# Patient Record
Sex: Female | Born: 1970 | Race: White | Hispanic: No | Marital: Married | Smoking: Never smoker
Health system: Southern US, Community
[De-identification: ages and names within clinical notes are randomized; demographics above are authoritative.]

## PROBLEM LIST (undated history)

## (undated) DIAGNOSIS — N921 Excessive and frequent menstruation with irregular cycle: Secondary | ICD-10-CM

## (undated) DIAGNOSIS — R102 Pelvic and perineal pain: Secondary | ICD-10-CM

## (undated) HISTORY — PX: TUBAL LIGATION: SHX77

## (undated) HISTORY — PX: CERVICAL CONE BIOPSY: SUR198

## (undated) HISTORY — DX: Pelvic and perineal pain: R10.2

## (undated) HISTORY — PX: LEEP: SHX91

## (undated) HISTORY — DX: Excessive and frequent menstruation with irregular cycle: N92.1

---

## 1999-04-03 ENCOUNTER — Emergency Department (HOSPITAL_COMMUNITY): Admission: EM | Admit: 1999-04-03 | Discharge: 1999-04-03 | Payer: Self-pay | Admitting: Emergency Medicine

## 1999-04-03 ENCOUNTER — Encounter: Payer: Self-pay | Admitting: Emergency Medicine

## 2005-10-25 ENCOUNTER — Emergency Department (HOSPITAL_COMMUNITY): Admission: EM | Admit: 2005-10-25 | Discharge: 2005-10-25 | Payer: Self-pay | Admitting: Emergency Medicine

## 2011-04-05 ENCOUNTER — Ambulatory Visit: Payer: Self-pay

## 2011-04-13 ENCOUNTER — Ambulatory Visit: Payer: Self-pay

## 2011-04-14 LAB — PATHOLOGY REPORT

## 2013-02-26 LAB — HM PAP SMEAR

## 2015-03-03 ENCOUNTER — Encounter: Payer: Self-pay | Admitting: *Deleted

## 2015-03-04 ENCOUNTER — Ambulatory Visit: Payer: Self-pay | Admitting: Cardiovascular Disease

## 2015-03-04 ENCOUNTER — Encounter: Payer: Self-pay | Admitting: *Deleted

## 2015-03-05 ENCOUNTER — Encounter: Payer: Self-pay | Admitting: *Deleted

## 2016-11-11 ENCOUNTER — Emergency Department (HOSPITAL_COMMUNITY)
Admission: EM | Admit: 2016-11-11 | Discharge: 2016-11-12 | Disposition: A | Discharge status: Home / Self Care | Payer: BLUE CROSS/BLUE SHIELD | Attending: Emergency Medicine | Admitting: Emergency Medicine

## 2016-11-11 ENCOUNTER — Encounter (HOSPITAL_COMMUNITY): Payer: Self-pay

## 2016-11-11 ENCOUNTER — Emergency Department (HOSPITAL_COMMUNITY): Payer: BLUE CROSS/BLUE SHIELD

## 2016-11-11 DIAGNOSIS — W109XXA Fall (on) (from) unspecified stairs and steps, initial encounter: Secondary | ICD-10-CM | POA: Insufficient documentation

## 2016-11-11 DIAGNOSIS — Y9301 Activity, walking, marching and hiking: Secondary | ICD-10-CM | POA: Insufficient documentation

## 2016-11-11 DIAGNOSIS — Y999 Unspecified external cause status: Secondary | ICD-10-CM | POA: Insufficient documentation

## 2016-11-11 DIAGNOSIS — S93402A Sprain of unspecified ligament of left ankle, initial encounter: Secondary | ICD-10-CM

## 2016-11-11 DIAGNOSIS — Y929 Unspecified place or not applicable: Secondary | ICD-10-CM | POA: Diagnosis not present

## 2016-11-11 DIAGNOSIS — S99912A Unspecified injury of left ankle, initial encounter: Secondary | ICD-10-CM | POA: Diagnosis present

## 2016-11-11 MED ORDER — HYDROCODONE-ACETAMINOPHEN 5-325 MG PO TABS
1.0000 | ORAL_TABLET | Freq: Once | ORAL | Status: AC
  Administered 2016-11-11: 1 via ORAL
  Filled 2016-11-11: qty 1

## 2016-11-11 NOTE — ED Provider Notes (Signed)
[facility] DEPT Provider Note   CSN: [id number] Arrival date & time: 11/11/16  2250     History   Chief Complaint Chief Complaint  Patient presents with  . Ankle Pain    HPI Angel Robinson is a 46 y.o. female.  HPI   Angel Robinson is a 46 y.o. female who presents to the Emergency Department complaining of left ankle pain and swelling after a mechanical fall down two steps.  Injury occurred earlier today.  She complains of swelling to her lateral ankle and pain associated with movement and weight bearing.  She took aleve earlier with some pain relief.  She denies neck or back pain, head injury, LOC, numbness, weakness of the affected extremity, and pain proximal to the ankle.    Past Medical History:  Diagnosis Date  . Menometrorrhagia   . Pelvic pain in female     There are no active problems to display for this patient.   Past Surgical History:  Procedure Laterality Date  . CERVICAL CONE BIOPSY    . CESAREAN SECTION    . LEEP    . TUBAL LIGATION      OB History    No data available       Home Medications    Prior to Admission medications   Medication Sig Start Date End Date Taking? Authorizing Provider  Ibuprofen 200 MG CAPS Take by mouth as needed.   Yes Historical Provider, MD  fluconazole (DIFLUCAN) 150 MG tablet Take 150 mg by mouth daily.    Historical Provider, MD  Hydrocodone-Acetaminophen 5-300 MG TABS Take by mouth as needed.    Historical Provider, MD    Family History No family history on file.  Social History Social History  Substance Use Topics  . Smoking status: Never Smoker  . Smokeless tobacco: Never Used  . Alcohol use No     Allergies   Patient has no known allergies.   Review of Systems Review of Systems  Constitutional: Negative for chills and fever.  Gastrointestinal: Negative for nausea and vomiting.  Musculoskeletal: Positive for arthralgias (left ankle) and joint swelling. Negative for back pain and neck  pain.  Skin: Negative for color change and wound.  Neurological: Negative for dizziness, weakness and numbness.  All other systems reviewed and are negative.    Physical Exam Updated Vital Signs BP 134/87 (BP Location: Left Arm)   Pulse 101   Temp 99.5 F (37.5 C) (Oral)   Resp 15   Ht 5\' 2"  (1.575 m)   Wt 77.1 kg   LMP 10/15/2016   SpO2 98%   BMI 31.09 kg/m   Physical Exam  Constitutional: She is oriented to person, place, and time. She appears well-developed and well-nourished. No distress.  HENT:  Head: Normocephalic and atraumatic.  Cardiovascular: Normal rate, regular rhythm and intact distal pulses.   Pulmonary/Chest: Effort normal and breath sounds normal.  Musculoskeletal: She exhibits edema and tenderness. She exhibits no deformity.  ttp of the lateral left ankle.  Moderate edema.   DP pulse is brisk,distal sensation intact.  No erythema, abrasion, bruising or bony deformity.  No proximal tenderness.  Neurological: She is alert and oriented to person, place, and time. She exhibits normal muscle tone. Coordination normal.  Skin: Skin is warm and dry.  Nursing note and vitals reviewed.    ED Treatments / Results  Labs (all labs ordered are listed, but only abnormal results are displayed) Labs Reviewed - No data to display  EKG  EKG Interpretation None       Radiology Dg Ankle Complete Left  Result Date: 11/11/2016 CLINICAL DATA:  46 year old who fell down 2 or 3 stairs earlier today and injured the left ankle. Pain and swelling. Initial encounter. EXAM: LEFT ANKLE COMPLETE - 3+ VIEW COMPARISON:  None. FINDINGS: Mild diffuse soft tissue swelling. No evidence of acute fracture. Ankle mortise intact with well-preserved joint space. Well-preserved bone mineral density. No intrinsic osseous abnormalities. No visible joint effusion. IMPRESSION: No osseous abnormality. Electronically Signed   By: Hulan Saas M.D.   On: 11/11/2016 23:46     Procedures Procedures (including critical care time)  Medications Ordered in ED Medications  HYDROcodone-acetaminophen (NORCO/VICODIN) 5-325 MG per tablet 1 tablet (1 tablet Oral Given 11/11/16 2346)     Initial Impression / Assessment and Plan / ED Course  I have reviewed the triage vital signs and the nursing notes.  Pertinent labs & imaging results that were available during my care of the patient were reviewed by me and considered in my medical decision making (see chart for details).  Clinical Course     Pt with ankle pain secondary to a fall.  Moderate edema and tenderness of the lateral ankle.  XR neg for fx. Likely sprain.    ASO applied, crutches given.  NV intact.  Agrees to elevate, ice and symptomatic tx with NSAID.  Orthopedic f/u in one week if not improved  Final Clinical Impressions(s) / ED Diagnoses   Final diagnoses:  Sprain of left ankle, unspecified ligament, initial encounter    New Prescriptions New Prescriptions   No medications on file     Pauline Aus, PA-C 11/12/16 0011    Layla Maw Ward, DO 11/12/16 0240

## 2016-11-11 NOTE — ED Triage Notes (Signed)
Pt states she was walking down steps carrying a box and fell a couple of steps.  Pt reports pain to left ankle with swelling.

## 2016-11-12 MED ORDER — NAPROXEN 500 MG PO TABS: 500.0000 mg | ORAL_TABLET | Freq: Two times a day (BID) | ORAL | 0 refills | Status: DC

## 2016-11-12 MED ORDER — HYDROCODONE-ACETAMINOPHEN 5-325 MG PO TABS: ORAL_TABLET | ORAL | 0 refills | Status: DC

## 2016-11-12 NOTE — ED Notes (Signed)
Patient given discharge instructions and six pack Hydrocodone

## 2016-11-12 NOTE — Discharge Instructions (Signed)
Elevate and apply ice packs on/off to your ankle.  Use the crutches for walking for at least one week.  Call Dr. Mort SawyersHarrison's office to arrange a follow-up appt in one week if not improving

## 2016-11-15 MED FILL — Hydrocodone-Acetaminophen Tab 5-325 MG: ORAL | Qty: 6 | Status: AC

## 2018-06-29 IMAGING — DX DG ANKLE COMPLETE 3+V*L*
3 series · 3 of 3 positions shown · non-contrast
Comparison: None.

CLINICAL DATA: 45-year-old who fell down 2 or 3 stairs earlier
today and injured the left ankle. Pain and swelling. Initial
encounter.

EXAM:
LEFT ANKLE COMPLETE - 3+ VIEW

[ankle ap]
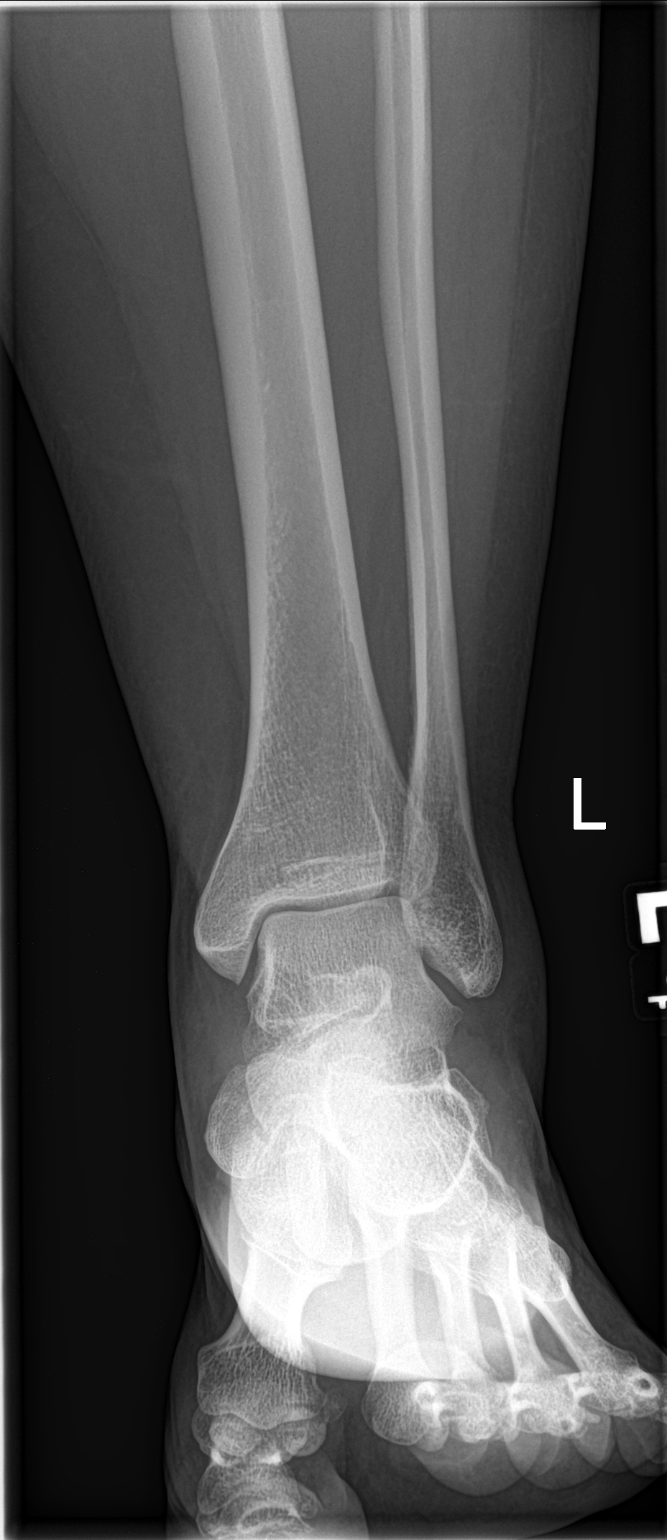

[ankle obl]
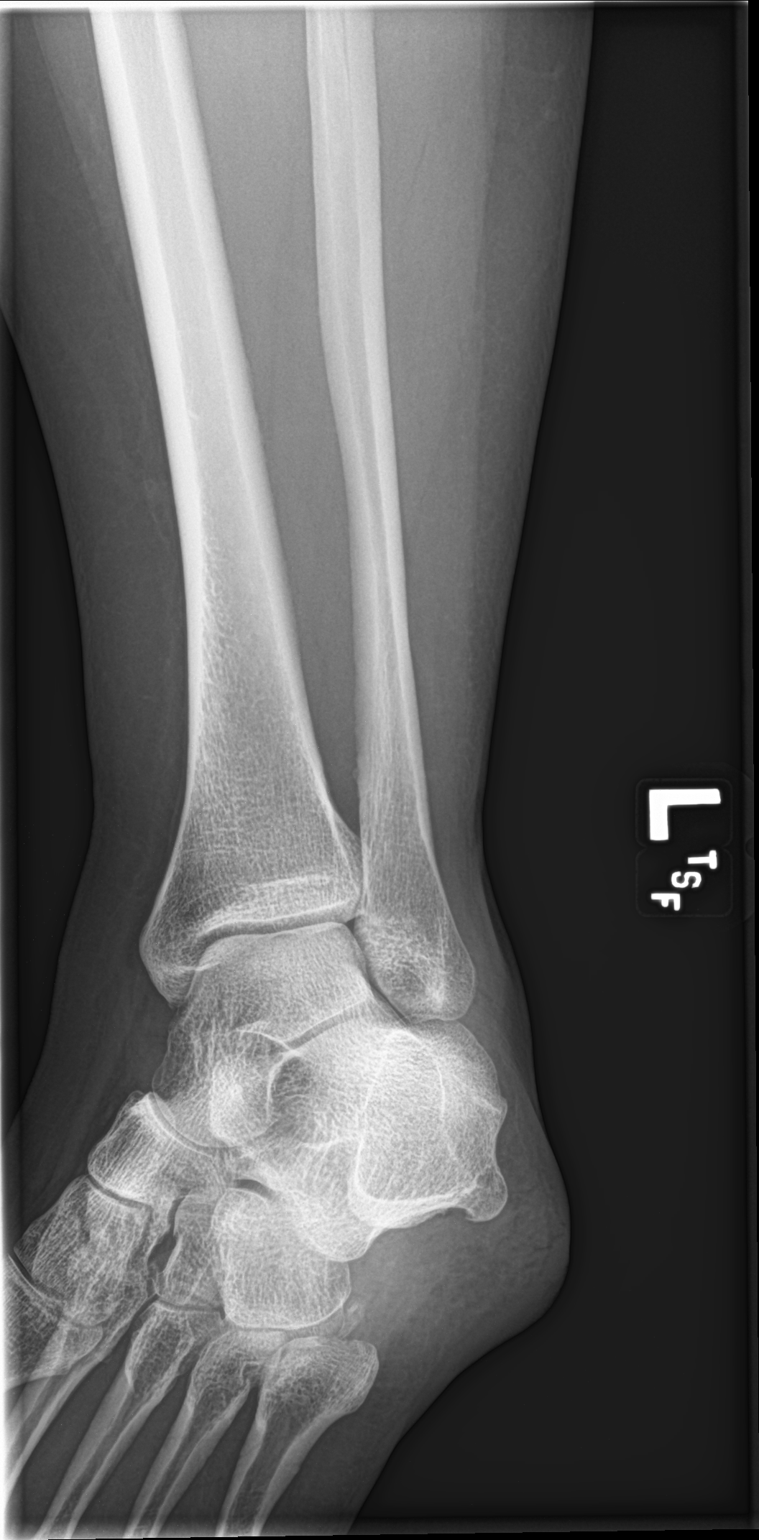

[ankle lat]
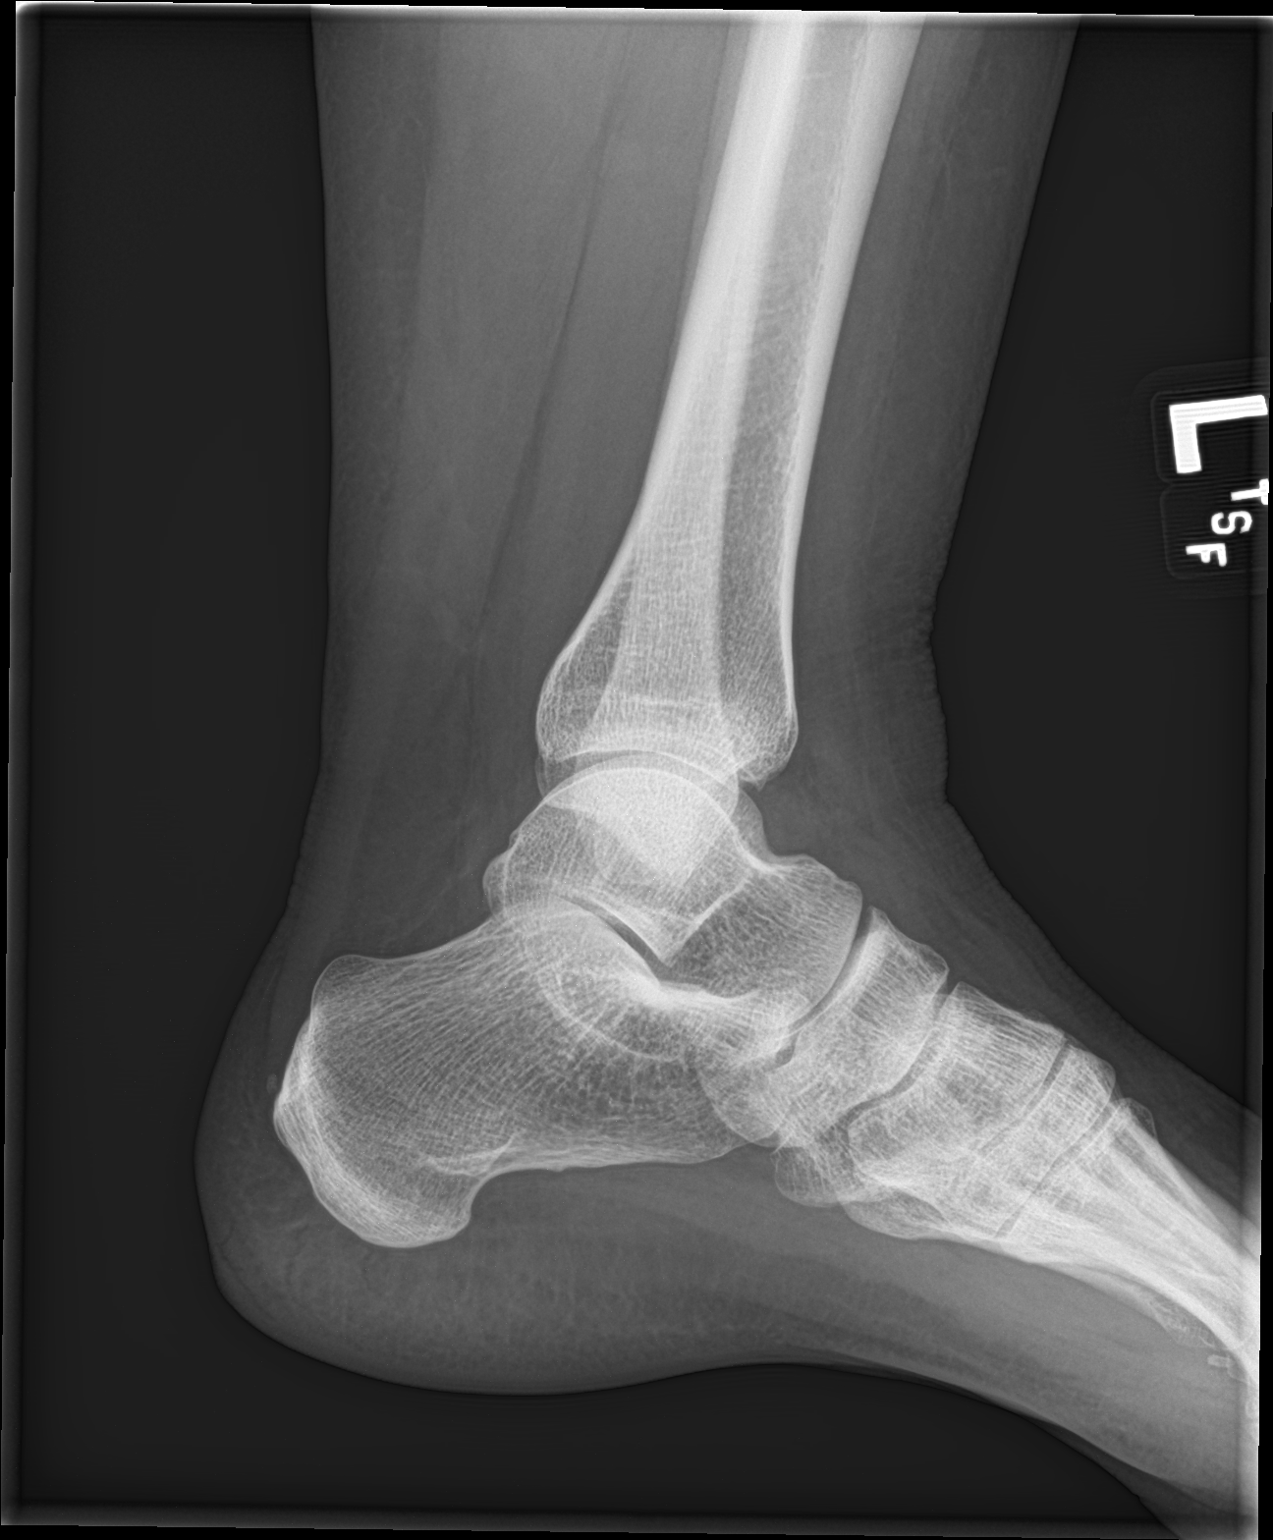

[3 of 3 positions shown; findings below may reference images not displayed]

FINDINGS: Mild diffuse soft tissue swelling. No evidence of acute fracture.
Ankle mortise intact with well-preserved joint space. Well-preserved
bone mineral density. No intrinsic osseous abnormalities. No visible
joint effusion.
IMPRESSION: No osseous abnormality.

## 2018-11-12 ENCOUNTER — Ambulatory Visit: Payer: BLUE CROSS/BLUE SHIELD | Admitting: Orthopaedic Surgery

## 2018-11-12 ENCOUNTER — Ambulatory Visit (INDEPENDENT_AMBULATORY_CARE_PROVIDER_SITE_OTHER): Payer: BLUE CROSS/BLUE SHIELD

## 2018-11-12 ENCOUNTER — Encounter: Payer: Self-pay | Admitting: Orthopaedic Surgery

## 2018-11-12 VITALS — BP 137/91 | HR 98 | Ht 61.0 in | Wt 184.0 lb

## 2018-11-12 DIAGNOSIS — G8929 Other chronic pain: Secondary | ICD-10-CM

## 2018-11-12 DIAGNOSIS — M25562 Pain in left knee: Secondary | ICD-10-CM

## 2018-11-12 MED ORDER — NAPROXEN 500 MG PO TABS: 500.0000 mg | ORAL_TABLET | Freq: Two times a day (BID) | ORAL | 5 refills | Status: DC

## 2018-11-12 NOTE — Progress Notes (Signed)
Subjective:    Patient ID: Angel Robinson, female    DOB: 1970/12/16, 48 y.o.   MRN: [id number]  HPI She has had pain of the left knee over the last five months or so that is worse at night.  She has no pain doing the day. She has no swelling or giving way and no redness. She has tried Advil and Aleve with some help.  It only hurts at night.  Activity is not affected. She has no paresthesias.  She has no color changes.    Review of Systems  Constitutional: Positive for activity change.  Musculoskeletal: Positive for arthralgias.  All other systems reviewed and are negative.  For Review of Systems, all other systems reviewed and are negative.  The following is a summary of the past history medically, past history surgically, known current medicines, social history and family history.  This information is gathered electronically by the computer from prior information and documentation.  I review this each visit and have found including this information at this point in the chart is beneficial and informative.   Past Medical History:  Diagnosis Date  . Menometrorrhagia   . Pelvic pain in female     Past Surgical History:  Procedure Laterality Date  . CERVICAL CONE BIOPSY    . CESAREAN SECTION    . LEEP    . TUBAL LIGATION      Current Outpatient Medications on File Prior to Visit  Medication Sig Dispense Refill  . fluconazole (DIFLUCAN) 150 MG tablet Take 150 mg by mouth daily.    Marland Kitchen HYDROcodone-acetaminophen (NORCO/VICODIN) 5-325 MG tablet Take one tab po q 4-6 hrs prn pain (Patient not taking: Reported on 11/12/2018) 6 tablet 0  . Ibuprofen 200 MG CAPS Take by mouth as needed.    . naproxen (NAPROSYN) 500 MG tablet Take 1 tablet (500 mg total) by mouth 2 (two) times daily with a meal. (Patient not taking: Reported on 11/12/2018) 20 tablet 0   No current facility-administered medications on file prior to visit.     Social History   Socioeconomic History  . Marital status:  Married    Spouse name: Not on file  . Number of children: Not on file  . Years of education: Not on file  . Highest education level: Not on file  Occupational History  . Not on file  Social Needs  . Financial resource strain: Not on file  . Food insecurity:    Worry: Not on file    Inability: Not on file  . Transportation needs:    Medical: Not on file    Non-medical: Not on file  Tobacco Use  . Smoking status: Never Smoker  . Smokeless tobacco: Never Used  Substance and Sexual Activity  . Alcohol use: No  . Drug use: Not on file  . Sexual activity: Not on file  Lifestyle  . Physical activity:    Days per week: Not on file    Minutes per session: Not on file  . Stress: Not on file  Relationships  . Social connections:    Talks on phone: Not on file    Gets together: Not on file    Attends religious service: Not on file    Active member of club or organization: Not on file    Attends meetings of clubs or organizations: Not on file    Relationship status: Not on file  . Intimate partner violence:    Fear of current or ex  partner: Not on file    Emotionally abused: Not on file    Physically abused: Not on file    Forced sexual activity: Not on file  Other Topics Concern  . Not on file  Social History Narrative  . Not on file    Family History  Problem Relation Age of Onset  . Diabetes Mother   . Heart disease Father     BP (!) 137/91   Pulse 98   Ht 5\' 1"  (1.549 m)   Wt 184 lb (83.5 kg)   BMI 34.77 kg/m   Body mass index is 34.77 kg/m.     Objective:   Physical Exam Constitutional:      Appearance: She is well-developed.  HENT:     Head: Normocephalic and atraumatic.  Eyes:     Conjunctiva/sclera: Conjunctivae normal.     Pupils: Pupils are equal, round, and reactive to light.  Neck:     Musculoskeletal: Normal range of motion and neck supple.  Cardiovascular:     Rate and Rhythm: Normal rate and regular rhythm.  Pulmonary:     Effort:  Pulmonary effort is normal.  Abdominal:     Palpations: Abdomen is soft.  Musculoskeletal:     Left knee: Tenderness found. Medial joint line tenderness noted.       Legs:  Skin:    General: Skin is warm and dry.  Neurological:     Mental Status: She is alert and oriented to person, place, and time.     Cranial Nerves: No cranial nerve deficit.     Motor: No abnormal muscle tone.     Coordination: Coordination normal.     Deep Tendon Reflexes: Reflexes are normal and symmetric. Reflexes normal.  Psychiatric:        Behavior: Behavior normal.        Thought Content: Thought content normal.        Judgment: Judgment normal.      X-rays were done of the left knee, reported separately.     Assessment & Plan:   Encounter Diagnosis  Name Primary?  . Chronic pain of left knee Yes   PROCEDURE NOTE:  The patient requests injections of the left knee , verbal consent was obtained.  The left knee was prepped appropriately after time out was performed.   Sterile technique was observed and injection of 1 cc of Depo-Medrol 40 mg with several cc's of plain xylocaine. Anesthesia was provided by ethyl chloride and a 20-gauge needle was used to inject the knee area. The injection was tolerated well.  A band aid dressing was applied.  The patient was advised to apply ice later today and tomorrow to the injection sight as needed.  I will give Rx for Naprosyn 500 po bid pc.  Return in two weeks.  Call if any problem.  Precautions discussed.   Electronically Signed Darreld Mclean, MD 1/7/20209:28 AM

## 2018-11-14 ENCOUNTER — Telehealth: Payer: Self-pay | Admitting: Orthopaedic Surgery

## 2018-11-14 NOTE — Telephone Encounter (Signed)
Angel Robinson called and stated that she was here on Tuesday and received an injection in her knee.  She continues to have pain with no relief.  She is taking the Naprosyn that was prescribed.    She wants to know how long will it take for the injection to give her some relief.  She also wants to know if there are any other suggestions for her.   During the conversation she did ask about getting pain medication.  I told her that Dr. Hilda Lias was here this morning but he was now gone until Tuesday.  I told her that I would speak to the clinical staff to see if they could offer any advice  Would you give her a call to see if you can offer any suggestions?  Thanks

## 2018-11-15 NOTE — Telephone Encounter (Signed)
Spoke with pt who states she's still in significant pain after injection. Has been using a rub and applying heat with no relief. Suggested to pt to use ice instead of heat and that it is possible that the steroid has not kicked in yet due to medications working differently in each patient. Informed pt that I would send the message to you in case you have any other suggestions. Please advise.

## 2018-11-19 NOTE — Telephone Encounter (Signed)
Continue the Naprosyn.  Sometimes the injections do not act as quickly as others.  Use rubs, ice.

## 2018-11-20 NOTE — Telephone Encounter (Signed)
This encounter was created in error - please disregard.

## 2018-11-22 NOTE — Telephone Encounter (Signed)
Pt notified and states she only received 2 days from injection. Pt does have an appointment next week and Dr. Hilda Lias will address continued pain.

## 2018-11-26 ENCOUNTER — Ambulatory Visit: Payer: BLUE CROSS/BLUE SHIELD | Admitting: Orthopaedic Surgery

## 2018-11-26 ENCOUNTER — Encounter: Payer: Self-pay | Admitting: Orthopaedic Surgery

## 2018-11-26 VITALS — BP 144/94 | HR 81 | Ht 61.0 in | Wt 180.0 lb

## 2018-11-26 DIAGNOSIS — M25562 Pain in left knee: Secondary | ICD-10-CM | POA: Diagnosis not present

## 2018-11-26 DIAGNOSIS — G8929 Other chronic pain: Secondary | ICD-10-CM | POA: Diagnosis not present

## 2018-11-26 MED ORDER — HYDROCODONE-ACETAMINOPHEN 5-325 MG PO TABS: ORAL_TABLET | ORAL | 0 refills | Status: DC

## 2018-11-26 NOTE — Progress Notes (Signed)
Patient WC:HENIDPO Angel Robinson, female DOB:09/22/1971, 48 y.o. EUM:[id number]  Chief Complaint  Patient presents with  . Knee Pain    left     HPI  Angel Robinson is a 48 y.o. female who has continued pain of the left knee.  The injection last time only lasted about two days maximum. She has pain with rest, not with motion. She has swelling and popping and medial pain. She has no giving way.  I am concerned about a medial meniscus tear but she has to wait six weeks from first seen for the insurance to pay for her MRI.  She understands this.   Body mass index is 34.01 kg/m.  ROS  Review of Systems  Constitutional: Positive for activity change.  Musculoskeletal: Positive for arthralgias.  All other systems reviewed and are negative.   All other systems reviewed and are negative.  The following is a summary of the past history medically, past history surgically, known current medicines, social history and family history.  This information is gathered electronically by the computer from prior information and documentation.  I review this each visit and have found including this information at this point in the chart is beneficial and informative.    Past Medical History:  Diagnosis Date  . Menometrorrhagia   . Pelvic pain in female     Past Surgical History:  Procedure Laterality Date  . CERVICAL CONE BIOPSY    . CESAREAN SECTION    . LEEP    . TUBAL LIGATION      Family History  Problem Relation Age of Onset  . Diabetes Mother   . Heart disease Father     Social History Social History   Tobacco Use  . Smoking status: Never Smoker  . Smokeless tobacco: Never Used  Substance Use Topics  . Alcohol use: No  . Drug use: Not on file    No Known Allergies  Current Outpatient Medications  Medication Sig Dispense Refill  . Ibuprofen 200 MG CAPS Take by mouth as needed.    . naproxen (NAPROSYN) 500 MG tablet Take 1 tablet (500 mg total) by mouth 2 (two) times  daily with a meal. 60 tablet 5  . HYDROcodone-acetaminophen (NORCO/VICODIN) 5-325 MG tablet One tablet by mouth every six hours as needed for pain. 56 tablet 0   No current facility-administered medications for this visit.      Physical Exam  Blood pressure (!) 144/94, pulse 81, height 5\' 1"  (1.549 m), weight 180 lb (81.6 kg).  Constitutional: overall normal hygiene, normal nutrition, well developed, normal grooming, normal body habitus. Assistive device:none  Musculoskeletal: gait and station Limp none, muscle tone and strength are normal, no tremors or atrophy is present.  .  Neurological: coordination overall normal.  Deep tendon reflex/nerve stretch intact.  Sensation normal.  Cranial nerves II-XII intact.   Skin:   Normal overall no scars, lesions, ulcers or rashes. No psoriasis.  Psychiatric: Alert and oriented x 3.  Recent memory intact, remote memory unclear.  Normal mood and affect. Well groomed.  Good eye contact.  Cardiovascular: overall no swelling, no varicosities, no edema bilaterally, normal temperatures of the legs and arms, no clubbing, cyanosis and good capillary refill.  Lymphatic: palpation is normal.  Left knee has slight effusion, tender medially, ROM 0 to 110, crepitus, positive medial Mc[place]ray, NV intact.  All other systems reviewed and are negative   The patient has been educated about the nature of the problem(s) and counseled on treatment  options.  The patient appeared to understand what I have discussed and is in agreement with it.  Encounter Diagnosis  Name Primary?  . Chronic pain of left knee Yes    PLAN Call if any problems.  Precautions discussed.  Continue current medications.   Return to clinic 1 month   I have reviewed the [facility] [place] Sub[place] web site prior to prescribing narcotic medicine for this patient.    Electronically Signed Darreld McleanWayne Terrionna Bridwell, MD 1/21/20209:17 AM

## 2018-12-16 ENCOUNTER — Telehealth: Payer: Self-pay | Admitting: Orthopaedic Surgery

## 2018-12-16 NOTE — Telephone Encounter (Signed)
Patient requests refill, pain medication:  HYDROcodone-acetaminophen (NORCO/VICODIN) 5-325 MG tablet 56 tablet  -Requests pharmacy: [facility], [place], [place]

## 2018-12-17 ENCOUNTER — Telehealth: Payer: Self-pay | Admitting: Orthopaedic Surgery

## 2018-12-17 MED ORDER — HYDROCODONE-ACETAMINOPHEN 5-325 MG PO TABS: ORAL_TABLET | ORAL | 0 refills | Status: DC

## 2018-12-17 NOTE — Telephone Encounter (Signed)
Informed pt that her 6 week mark is the 18th appointment moved up to the 18th for re evaluation and order MRI.

## 2018-12-17 NOTE — Telephone Encounter (Signed)
Patient is asking if it may be possible to try to get an MRI approved. She has not yet received her most recent insurance card, [employer] [address]. Patient aware she will likely need to wait as Dr Hilda Lias has advised, but wanted to ask again.

## 2018-12-24 ENCOUNTER — Encounter: Payer: Self-pay | Admitting: Orthopaedic Surgery

## 2018-12-24 ENCOUNTER — Ambulatory Visit: Payer: BLUE CROSS/BLUE SHIELD | Admitting: Orthopaedic Surgery

## 2018-12-24 VITALS — BP 144/98 | HR 94 | Ht 61.0 in | Wt 186.0 lb

## 2018-12-24 DIAGNOSIS — G8929 Other chronic pain: Secondary | ICD-10-CM

## 2018-12-24 DIAGNOSIS — M25562 Pain in left knee: Secondary | ICD-10-CM

## 2018-12-24 NOTE — Progress Notes (Signed)
Patient HT:XHFSFSE Angel Robinson, female DOB:[birth year], 48 y.o. LTR:[id number]  Chief Complaint  Patient presents with  . Knee Pain    left     HPI  Angel Robinson is a 48 y.o. female who has continued pain of the left knee with swelling, popping and giving way.  She has not improved. She has a burning pain now. She has no new trauma.  I will get a MRI as she has not responded to conservative treatment.   Body mass index is 35.14 kg/m.  ROS  Review of Systems  Constitutional: Positive for activity change.  Musculoskeletal: Positive for arthralgias.  All other systems reviewed and are negative.   All other systems reviewed and are negative.  The following is a summary of the past history medically, past history surgically, known current medicines, social history and family history.  This information is gathered electronically by the computer from prior information and documentation.  I review this each visit and have found including this information at this point in the chart is beneficial and informative.    Past Medical History:  Diagnosis Date  . Menometrorrhagia   . Pelvic pain in female     Past Surgical History:  Procedure Laterality Date  . CERVICAL CONE BIOPSY    . CESAREAN SECTION    . LEEP    . TUBAL LIGATION      Family History  Problem Relation Age of Onset  . Diabetes Mother   . Heart disease Father     Social History Social History   Tobacco Use  . Smoking status: Never Smoker  . Smokeless tobacco: Never Used  Substance Use Topics  . Alcohol use: No  . Drug use: Not on file    No Known Allergies  Current Outpatient Medications  Medication Sig Dispense Refill  . HYDROcodone-acetaminophen (NORCO/VICODIN) 5-325 MG tablet One tablet by mouth every six hours as needed for pain. 52 tablet 0  . Ibuprofen 200 MG CAPS Take by mouth as needed.    . naproxen (NAPROSYN) 500 MG tablet Take 1 tablet (500 mg total) by mouth 2 (two) times daily with a  meal. 60 tablet 5   No current facility-administered medications for this visit.      Physical Exam  Blood pressure (!) 144/98, pulse 94, height 5\' 1"  (1.549 m), weight 186 lb (84.4 kg).  Constitutional: overall normal hygiene, normal nutrition, well developed, normal grooming, normal body habitus. Assistive device:none  Musculoskeletal: gait and station Limp left, muscle tone and strength are normal, no tremors or atrophy is present.  .  Neurological: coordination overall normal.  Deep tendon reflex/nerve stretch intact.  Sensation normal.  Cranial nerves II-XII intact.   Skin:   Normal overall no scars, lesions, ulcers or rashes. No psoriasis.  Psychiatric: Alert and oriented x 3.  Recent memory intact, remote memory unclear.  Normal mood and affect. Well groomed.  Good eye contact.  Cardiovascular: overall no swelling, no varicosities, no edema bilaterally, normal temperatures of the legs and arms, no clubbing, cyanosis and good capillary refill.  Lymphatic: palpation is normal.  Left knee has small effusion, limp to the left, ROM 0 to 110, crepitus, positive medial McMurray, NV intact.  All other systems reviewed and are negative   The patient has been educated about the nature of the problem(s) and counseled on treatment options.  The patient appeared to understand what I have discussed and is in agreement with it.  Encounter Diagnosis  Name Primary?  Marland Kitchen  Chronic pain of left knee Yes    PLAN Call if any problems.  Precautions discussed.  Continue current medications.   Return to clinic after MRI of the left knee.   Electronically Signed Darreld Mclean, MD 2/18/20208:44 AM

## 2018-12-25 ENCOUNTER — Telehealth: Payer: Self-pay | Admitting: Orthopaedic Surgery

## 2018-12-25 NOTE — Telephone Encounter (Signed)
Patient called wanting to speak to the nurse. She has questions about [facility].  Please call and advise

## 2018-12-27 NOTE — Telephone Encounter (Signed)
Left VM to call back with questions.  

## 2018-12-30 ENCOUNTER — Ambulatory Visit (HOSPITAL_COMMUNITY)
Admission: RE | Admit: 2018-12-30 | Discharge: 2018-12-30 | Disposition: A | Discharge status: Home / Self Care | Payer: BLUE CROSS/BLUE SHIELD | Source: Ambulatory Visit | Attending: Orthopaedic Surgery | Admitting: Orthopaedic Surgery

## 2018-12-30 DIAGNOSIS — G8929 Other chronic pain: Secondary | ICD-10-CM | POA: Diagnosis present

## 2018-12-30 DIAGNOSIS — M25562 Pain in left knee: Secondary | ICD-10-CM | POA: Diagnosis present

## 2018-12-31 ENCOUNTER — Telehealth: Payer: Self-pay | Admitting: Orthopaedic Surgery

## 2018-12-31 ENCOUNTER — Ambulatory Visit: Payer: BLUE CROSS/BLUE SHIELD | Admitting: Orthopaedic Surgery

## 2018-12-31 ENCOUNTER — Encounter: Payer: Self-pay | Admitting: Orthopaedic Surgery

## 2018-12-31 VITALS — BP 130/90 | HR 79 | Ht 61.0 in | Wt 185.0 lb

## 2018-12-31 DIAGNOSIS — G8929 Other chronic pain: Secondary | ICD-10-CM

## 2018-12-31 DIAGNOSIS — M25562 Pain in left knee: Secondary | ICD-10-CM | POA: Diagnosis not present

## 2018-12-31 MED ORDER — HYDROCODONE-ACETAMINOPHEN 5-325 MG PO TABS: ORAL_TABLET | ORAL | 0 refills | Status: DC

## 2018-12-31 NOTE — Telephone Encounter (Signed)
Abella called back stating she forgot to ask for refill on Hydrocodone/Acetgaminophen 5-325  Mgs.  Qty  52  Sig: One tablet by mouth every six hours as needed for pain.        [facility] [place]

## 2018-12-31 NOTE — Progress Notes (Signed)
Patient ZD:[id number] Angel Robinson, female DOB:October 23, 1971, 48 y.o. VQQ:[id number]  Chief Complaint  Patient presents with  . Knee Pain    Chronic left knee pain.    HPI  Angel Robinson is a 48 y.o. female who has continued pain of the left knee.  She had a MRI which showed: IMPRESSION: 1. 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 2. Intact ligamentous structures and no acute bony findings. 3. Mild tricompartmental degenerative chondrosis. 4. No joint effusion.  Moderate-sized Baker's cyst.  I explained the findings to her.  She says it really bothers her more and more and she would like to consider surgery.  I told her she does not have a discrete tear but has problems with the meniscus. She would like to discuss this further with Dr. Romeo Apple.  I will arrange appointment.   Body mass index is 34.96 kg/m.  ROS  Review of Systems  Constitutional: Positive for activity change.  Musculoskeletal: Positive for arthralgias.  All other systems reviewed and are negative.   All other systems reviewed and are negative.  The following is a summary of the past history medically, past history surgically, known current medicines, social history and family history.  This information is gathered electronically by the computer from prior information and documentation.  I review this each visit and have found including this information at this point in the chart is beneficial and informative.    Past Medical History:  Diagnosis Date  . Menometrorrhagia   . Pelvic pain in female     Past Surgical History:  Procedure Laterality Date  . CERVICAL CONE BIOPSY    . CESAREAN SECTION    . LEEP    . TUBAL LIGATION      Family History  Problem Relation Age of Onset  . Diabetes Mother   . Heart disease Father     Social History Social History   Tobacco Use  . Smoking status: Never Smoker  . Smokeless tobacco: Never Used  Substance Use  Topics  . Alcohol use: No  . Drug use: Not on file    No Known Allergies  Current Outpatient Medications  Medication Sig Dispense Refill  . HYDROcodone-acetaminophen (NORCO/VICODIN) 5-325 MG tablet One tablet by mouth every six hours as needed for pain. 52 tablet 0  . Ibuprofen 200 MG CAPS Take by mouth as needed.    . naproxen (NAPROSYN) 500 MG tablet Take 1 tablet (500 mg total) by mouth 2 (two) times daily with a meal. 60 tablet 5   No current facility-administered medications for this visit.      Physical Exam  Blood pressure 130/90, pulse 79, height 5\' 1"  (1.549 m), weight 185 lb (83.9 kg).  Constitutional: overall normal hygiene, normal nutrition, well developed, normal grooming, normal body habitus. Assistive device:none  Musculoskeletal: gait and station Limp left, muscle tone and strength are normal, no tremors or atrophy is present.  .  Neurological: coordination overall normal.  Deep tendon reflex/nerve stretch intact.  Sensation normal.  Cranial nerves II-XII intact.   Skin:   Normal overall no scars, lesions, ulcers or rashes. No psoriasis.  Psychiatric: Alert and oriented x 3.  Recent memory intact, remote memory unclear.  Normal mood and affect. Well groomed.  Good eye contact.  Cardiovascular: overall no swelling, no varicosities, no edema bilaterally, normal temperatures of the legs and arms, no clubbing, cyanosis and good capillary refill.  Lymphatic: palpation is normal.  Left knee has slight effusion, crepitus,  ROM 0 to 110, positive medial McMurray, slight limp to the left.  NV intact.  All other systems reviewed and are negative   The patient has been educated about the nature of the problem(s) and counseled on treatment options.  The patient appeared to understand what I have discussed and is in agreement with it.  No diagnosis found.  PLAN Call if any problems.  Precautions discussed.  Continue current medications.   Return to clinic to see Dr.  Romeo Apple   Electronically Signed Darreld Mclean, MD 2/25/20209:21 AM

## 2019-01-07 ENCOUNTER — Other Ambulatory Visit: Payer: Self-pay

## 2019-01-07 ENCOUNTER — Encounter: Payer: Self-pay | Admitting: Orthopedic Surgery

## 2019-01-07 ENCOUNTER — Ambulatory Visit: Payer: BLUE CROSS/BLUE SHIELD | Admitting: Orthopedic Surgery

## 2019-01-07 ENCOUNTER — Ambulatory Visit: Payer: BLUE CROSS/BLUE SHIELD | Admitting: Orthopaedic Surgery

## 2019-01-07 VITALS — BP 133/87 | HR 87 | Ht 61.0 in

## 2019-01-07 DIAGNOSIS — M25562 Pain in left knee: Secondary | ICD-10-CM

## 2019-01-07 DIAGNOSIS — G8929 Other chronic pain: Secondary | ICD-10-CM | POA: Diagnosis not present

## 2019-01-07 DIAGNOSIS — M2242 Chondromalacia patellae, left knee: Secondary | ICD-10-CM | POA: Diagnosis not present

## 2019-01-07 NOTE — Progress Notes (Signed)
PREOP CONSULT/REFERRAL INTRA-OFFICE FROM DR Gaylene Brooks   Chief Complaint  Patient presents with  . Knee Pain    left     48 year old female presents for possible surgery left knee.  The patient has given the following history  6 months ago she started having pain in her left knee.  She cannot localize the pain but notes that it ached it was worse at night when she was lying in bed then it became worse also with activity although lying in bed seems to be the most intense pain.  She says it feels like a burning sensation in the knee that radiates to the foot at times though she denies any back pain.  She has been treated with injection ibuprofen and hydrocodone without relief  She has not had any catching locking giving way or mechanical symptoms  She has had an MRI which showed degenerative signal in the medial meniscus without tear  She says she is tired of being in pain and just wants something done to relieve her symptoms   Review of Systems  Constitutional: Negative for fever.  Musculoskeletal: Negative for back pain.  Neurological:       BURNING PAIN RADIATING DOWN THE LEG      Past Medical History:  Diagnosis Date  . Menometrorrhagia   . Pelvic pain in female     Past Surgical History:  Procedure Laterality Date  . CERVICAL CONE BIOPSY    . CESAREAN SECTION    . LEEP    . TUBAL LIGATION      Family History  Problem Relation Age of Onset  . Diabetes Mother   . Heart disease Father    Social History   Tobacco Use  . Smoking status: Never Smoker  . Smokeless tobacco: Never Used  Substance Use Topics  . Alcohol use: No  . Drug use: Not on file    No Known Allergies   Current Meds  Medication Sig  . HYDROcodone-acetaminophen (NORCO/VICODIN) 5-325 MG tablet One tablet by mouth every six hours as needed for pain.  . Ibuprofen 200 MG CAPS Take by mouth as needed.    BP 133/87   Pulse 87   Ht 5\' 1"  (1.549 m)   BMI 34.96 kg/m   Physical  Exam Constitutional:      Appearance: Normal appearance.  HENT:     Head: Normocephalic and atraumatic.  Eyes:     General: No scleral icterus.    Extraocular Movements: Extraocular movements intact.     Conjunctiva/sclera: Conjunctivae normal.     Pupils: Pupils are equal, round, and reactive to light.  Cardiovascular:     Rate and Rhythm: Normal rate.     Pulses: Normal pulses.  Musculoskeletal:     Right knee: She exhibits no effusion.     Left knee: She exhibits no effusion.  Skin:    General: Skin is warm and dry.     Coloration: Skin is not jaundiced or pale.     Findings: No bruising, erythema, lesion or rash.  Neurological:     General: No focal deficit present.     Mental Status: She is alert and oriented to person, place, and time.  Psychiatric:        Mood and Affect: Mood normal.        Behavior: Behavior normal.        Thought Content: Thought content normal.        Judgment: Judgment normal.     Right  Knee Exam   Muscle Strength  The patient has normal right knee strength.  Tenderness  The patient is experiencing no tenderness.   Range of Motion  Extension: normal  Flexion: normal   Tests  McMurray:  Medial - negative Lateral - negative Varus: negative Valgus: negative Drawer:  Anterior - negative    Posterior - negative  Other  Erythema: absent Scars: absent Sensation: normal Pulse: present Swelling: none Effusion: no effusion present   Left Knee Exam   Muscle Strength  The patient has normal left knee strength.  Tenderness  Left knee tenderness location: HYPERSENSITIVE PATELLA.  Range of Motion  Extension: normal  Flexion: normal   Tests  McMurray:  Medial - negative Lateral - negative Varus: negative Valgus: negative Drawer:  Anterior - negative     Posterior - negative Patellar apprehension: positive  Other  Erythema: absent Scars: absent Sensation: normal Pulse: present Swelling: none Effusion: no effusion  present  Comments:  45 DEGREES SLR WAS VERY PAINFUL AND THE PAIN WAS IN THE KNEE       MEDICAL DECISION SECTION    My independent reading of xrays:  1. PLAIN FILM:  Alignment 5 degrees valgus Trace joint effusion on x-ray taken November 12, 2018 Symmetric joint space narrowing medial lateral compartments tibial spine peaking no cyst formation osteophyte formation or subchondral sclerosis  2.  MRI shows diffuse tricompartmental chondrosis Degenerative signal changes in the meniscus  MRI REPORT FINDINGS: MENISCI   Medial meniscus: Intrasubstance mucinous degenerative changes but no definite meniscal tear.   Lateral meniscus:  Intact   LIGAMENTS   Cruciates:  Intact   Collaterals:  Intact   CARTILAGE   Patellofemoral:  Mild degenerative chondrosis.   Medial:  Mild degenerative chondrosis.   Lateral:  Mild degenerative chondrosis.   Joint:  Small amount of joint fluid but no overt joint effusion.   Popliteal Fossa: Moderate-sized Baker's cyst dissecting superiorly  Encounter Diagnoses  Name Primary?  . Chondromalacia patellae of left knee Yes  . Chronic pain of left knee      PLAN:  TRY PT FIRST We discussed the images of the MRI and the findings and decided to try some physical therapy first we also talked about possible hyaluronic acid injection platelet rich plasma injection and stem cell injection  Patient will come back 4 to 5 weeks after her first PT visit  No orders of the defined types were placed in this encounter.   Fuller Canada, MD 01/07/2019 2:59 PM

## 2019-01-07 NOTE — Patient Instructions (Addendum)
Physical therapy has been ordered for you at [facility] [phone] is the phone number to call if you want to call to schedule   Sodium Hyaluronate intra-articular injection What is this medicine? SODIUM HYALURONATE (SOE dee um hye al yoor ON ate) is used to treat pain in the knee due to osteoarthritis. This medicine may be used for other purposes; ask your health care provider or pharmacist if you have questions. COMMON BRAND NAME(S): Amvisc, DUROLANE, Euflexxa, GELSYN-3, Hyalgan, Hymovis, Monovisc, Orthovisc, Supartz, Supartz FX, TriVisc, VISCO What should I tell my health care provider before I take this medicine? They need to know if you have any of these conditions: -bleeding disorders -glaucoma -infection in the knee joint -skin conditions or sensitivity -skin infection -an unusual allergic reaction to sodium hyaluronate, other medicines, foods, dyes, or preservatives. Different brands of sodium hyaluronate contain different allergens. Some may contain egg. Talk to your doctor about your allergies to make sure that you get the right product. -pregnant or trying to get pregnant -breast-feeding How should I use this medicine? This medicine is for injection into the knee joint. It is given by a health care professional in a hospital or clinic setting. Talk to your pediatrician regarding the use of this medicine in children. Special care may be needed. Overdosage: If you think you have taken too much of this medicine contact a poison control center or emergency room at once. NOTE: This medicine is only for you. Do not share this medicine with others. What if I miss a dose? This does not apply. What may interact with this medicine? Interactions are not expected. This list may not describe all possible interactions. Give your health care provider a list of all the medicines, herbs, non-prescription drugs, or dietary supplements you use. Also tell them if you smoke, drink alcohol, or use  illegal drugs. Some items may interact with your medicine. What should I watch for while using this medicine? Tell your doctor or healthcare professional if your symptoms do not start to get better or if they get worse. If receiving this medicine for osteoarthritis, limit your activity after you receive your injection. Avoid physical activity for 48 hours following your injection to keep your knee from swelling. Do not stand on your feet for more than 1 hour at a time during the first 48 hours following your injection. Ask your doctor or healthcare professional about when you can begin major physical activity again. What side effects may I notice from receiving this medicine? Side effects that you should report to your doctor or health care professional as soon as possible: -allergic reactions like skin rash, itching or hives, swelling of the face, lips, or tongue -dizziness -facial flushing -pain, tingling, numbness in the hands or feet -vision changes if received this medicine during eye surgery Side effects that usually do not require medical attention (report to your doctor or health care professional if they continue or are bothersome): -back pain -bruising at site where injected -chills -diarrhea -fever -headache -joint pain -joint stiffness -joint swelling -muscle cramps -muscle pain -nausea, vomiting -pain, redness, or irritation at site where injected -weak or tired This list may not describe all possible side effects. Call your doctor for medical advice about side effects. You may report side effects to FDA at 1-800-FDA-1088. Where should I keep my medicine? This drug is given in a hospital or clinic and will not be stored at home. NOTE: This sheet is a summary. It may not cover  all possible information. If you have questions about this medicine, talk to your doctor, pharmacist, or health care provider.  2019 Elsevier/Gold Standard (2015-11-25 08:34:51)  MAKE APPT 4 WEEKS  AFTER FIRST PT VISIT

## 2019-01-08 ENCOUNTER — Telehealth (HOSPITAL_COMMUNITY): Payer: Self-pay | Admitting: General Practice

## 2019-01-08 NOTE — Telephone Encounter (Signed)
01/08/19  I called to schedule therapy but patient asked that I verify her insurance first.  I called and she has 80-20% co-insurance; Dedcutible $8000 - 3552.08 met; OOP $5050 with 3552.08 met; visits are based on medical necessity and is a calendar year plan.  Ref.# [name]. I left patient a message with this information

## 2019-01-13 ENCOUNTER — Ambulatory Visit: Payer: BLUE CROSS/BLUE SHIELD | Admitting: Orthopedic Surgery

## 2019-01-16 ENCOUNTER — Other Ambulatory Visit: Payer: Self-pay | Admitting: Orthopedic Surgery

## 2019-01-16 NOTE — Telephone Encounter (Signed)
Patient requests refill on Hydrocodone/Acetaminophen 5-325  Mgs.  Qty  52  Sig: One tablet by mouth every six hours as needed for pain.  Patient states [facility] uses [facility] [place]

## 2019-02-12 ENCOUNTER — Ambulatory Visit (INDEPENDENT_AMBULATORY_CARE_PROVIDER_SITE_OTHER): Payer: BLUE CROSS/BLUE SHIELD | Admitting: Orthopedic Surgery

## 2019-02-12 ENCOUNTER — Other Ambulatory Visit: Payer: Self-pay

## 2019-02-12 DIAGNOSIS — M25562 Pain in left knee: Secondary | ICD-10-CM | POA: Diagnosis not present

## 2019-02-12 DIAGNOSIS — G8929 Other chronic pain: Secondary | ICD-10-CM | POA: Diagnosis not present

## 2019-02-12 DIAGNOSIS — M2242 Chondromalacia patellae, left knee: Secondary | ICD-10-CM

## 2019-02-12 MED ORDER — GABAPENTIN 100 MG PO CAPS: 100.0000 mg | ORAL_CAPSULE | Freq: Three times a day (TID) | ORAL | 2 refills | Status: DC

## 2019-02-12 MED ORDER — HYDROCODONE-ACETAMINOPHEN 5-325 MG PO TABS: 1.0000 | ORAL_TABLET | Freq: Two times a day (BID) | ORAL | 0 refills | Status: AC | PRN

## 2019-02-12 NOTE — Progress Notes (Signed)
Virtual Visit via Telephone Note   Today:  Chief complaint left knee pain with right knee starting to hurt similar to the left knee  History 48 year old female previously treated with injection and ibuprofen had an MRI which showed arthritis degeneration medial meniscus without tear  She was scheduled for physical therapy but due to COVID-19 therapy was never started  She is taken some Advil at night as she seems to have more symptoms when she is lying in the bed.  It causes her to have to get up and walk around.  She has no pain when ambulating on the knees.  She is using heat  She locates pain more front than back and describes a burning aching feeling   Review of systems again denies any back pain  Encounter Diagnoses  Name Primary?  . Chondromalacia patellae of left knee Yes  . Chronic pain of left knee     Assessment and plan  Again I feel this patient may have incidental finding of chondromalacia and arthritis in her knee with some underlying neurogenic or lumbar disc problem causing her pain which would make sense when she is lying down goes into extension relieved by standing up and walking.  Meds ordered this encounter  Medications  . gabapentin (NEURONTIN) 100 MG capsule    Sig: Take 1 capsule (100 mg total) by mouth 3 (three) times daily.    Dispense:  90 capsule    Refill:  2  . HYDROcodone-acetaminophen (NORCO/VICODIN) 5-325 MG tablet    Sig: Take 1 tablet by mouth every 12 (twelve) hours as needed for up to 7 days for moderate pain.    Dispense:  14 tablet    Refill:  0    We discussed the issue with opioids we would like her to start gabapentin 100 mg 3 times a day hydrocodone twice a day as needed  I will revisit this via virtual visit in 4 weeks  Once we are able to get her back in the office we should x-ray her back  Fuller Canada, MD   I connected with Vanetta Mulders on 02/12/19 at  1:50 PM EDT by telephone and verified that I am speaking  with the correct person using two identifiers.   I discussed the limitations, risks, security and privacy concerns of performing an evaluation and management service by telephone and the availability of in person appointments. I also discussed with the patient that there may be a patient responsible charge related to this service. The patient expressed understanding and agreed to proceed.   I discussed the assessment and treatment plan with the patient. The patient was provided an opportunity to ask questions and all were answered. The patient agreed with the plan and demonstrated an understanding of the instructions.   The patient was advised to call back or seek an in-person evaluation if the symptoms worsen or if the condition fails to improve as anticipated.  I provided 9 minutes of non-face-to-face time during this encounter.    Chief Complaint  Patient presents with  . Knee Pain      left       48 year old female presents for possible surgery left knee.  The patient has given the following history   6 months ago she started having pain in her left knee.  She cannot localize the pain but notes that it ached it was worse at night when she was lying in bed then it became worse also with activity although lying in  bed seems to be the most intense pain.  She says it feels like a burning sensation in the knee that radiates to the foot at times though she denies any back pain.  She has been treated with injection ibuprofen and hydrocodone without relief   She has not had any catching locking giving way or mechanical symptoms   She has had an MRI which showed degenerative signal in the medial meniscus without tear   She says she is tired of being in pain and just wants something done to relieve her symptoms     Review of Systems  Constitutional: Negative for fever.  Musculoskeletal: Negative for back pain.  Neurological:       BURNING PAIN RADIATING DOWN THE LEG             Past  Medical History:  Diagnosis Date  . Menometrorrhagia    . Pelvic pain in female             Past Surgical History:  Procedure Laterality Date  . CERVICAL CONE BIOPSY      . CESAREAN SECTION      . LEEP      . TUBAL LIGATION               Family History  Problem Relation Age of Onset  . Diabetes Mother    . Heart disease Father      Social History        Tobacco Use  . Smoking status: Never Smoker  . Smokeless tobacco: Never Used  Substance Use Topics  . Alcohol use: No  . Drug use: Not on file      No Known Allergies     Current Meds  Medication Sig  . HYDROcodone-acetaminophen (NORCO/VICODIN) 5-325 MG tablet One tablet by mouth every six hours as needed for pain.  . Ibuprofen 200 MG CAPS Take by mouth as needed.      BP 133/87   Pulse 87   Ht 5\' 1"  (1.549 m)   BMI 34.96 kg/m    Physical Exam Constitutional:      Appearance: Normal appearance.  HENT:     Head: Normocephalic and atraumatic.  Eyes:     General: No scleral icterus.    Extraocular Movements: Extraocular movements intact.     Conjunctiva/sclera: Conjunctivae normal.     Pupils: Pupils are equal, round, and reactive to light.  Cardiovascular:     Rate and Rhythm: Normal rate.     Pulses: Normal pulses.  Musculoskeletal:     Right knee: She exhibits no effusion.     Left knee: She exhibits no effusion.  Skin:    General: Skin is warm and dry.     Coloration: Skin is not jaundiced or pale.     Findings: No bruising, erythema, lesion or rash.  Neurological:     General: No focal deficit present.     Mental Status: She is alert and oriented to person, place, and time.  Psychiatric:        Mood and Affect: Mood normal.        Behavior: Behavior normal.        Thought Content: Thought content normal.        Judgment: Judgment normal.        Right Knee Exam    Muscle Strength  The patient has normal right knee strength.   Tenderness  The patient is experiencing no tenderness.     Range of Motion  Extension: normal  Flexion: normal    Tests  McMurray:  Medial - negative Lateral - negative Varus: negative Valgus: negative Drawer:  Anterior - negative    Posterior - negative   Other  Erythema: absent Scars: absent Sensation: normal Pulse: present Swelling: none Effusion: no effusion present     Left Knee Exam    Muscle Strength  The patient has normal left knee strength.   Tenderness  Left knee tenderness location: HYPERSENSITIVE PATELLA.   Range of Motion  Extension: normal  Flexion: normal    Tests  McMurray:  Medial - negative Lateral - negative Varus: negative Valgus: negative Drawer:  Anterior - negative     Posterior - negative Patellar apprehension: positive   Other  Erythema: absent Scars: absent Sensation: normal Pulse: present Swelling: none Effusion: no effusion present   Comments:  45 DEGREES SLR WAS VERY PAINFUL AND THE PAIN WAS IN THE KNEE            MEDICAL DECISION SECTION      My independent reading of xrays:  1. PLAIN FILM:  Alignment 5 degrees valgus Trace joint effusion on x-ray taken November 12, 2018 Symmetric joint space narrowing medial lateral compartments tibial spine peaking no cyst formation osteophyte formation or subchondral sclerosis   2.  MRI shows diffuse tricompartmental chondrosis Degenerative signal changes in the meniscus   MRI REPORT FINDINGS: MENISCI   Medial meniscus: Intrasubstance mucinous degenerative changes but no definite meniscal tear.   Lateral meniscus:  Intact   LIGAMENTS   Cruciates:  Intact   Collaterals:  Intact   CARTILAGE   Patellofemoral:  Mild degenerative chondrosis.   Medial:  Mild degenerative chondrosis.   Lateral:  Mild degenerative chondrosis.   Joint:  Small amount of joint fluid but no overt joint effusion.   Popliteal Fossa: Moderate-sized Baker's cyst dissecting superiorly       Encounter Diagnoses  Name Primary?  . Chondromalacia  patellae of left knee Yes  . Chronic pain of left knee          PLAN:  TRY PT FIRST We discussed the images of the MRI and the findings and decided to try some physical therapy first we also talked about possible hyaluronic acid injection platelet rich plasma injection and stem cell injection   Patient will come back 4 to 5 weeks after her first PT visit

## 2019-02-12 NOTE — Patient Instructions (Signed)
Instructions were given to her to start the 2 new medications.  We talked about opioid medication and potential abuse and addiction

## 2019-02-27 ENCOUNTER — Other Ambulatory Visit: Payer: Self-pay | Admitting: Orthopedic Surgery

## 2019-02-27 NOTE — Telephone Encounter (Signed)
Patient is asking for pain medication, she was last given by Dr. Romeo Apple   Hydrocodone-Acetaminophen  5/325 mg  Qty  52 Tablets  One tablet by mouth every six hours as needed for pain.  PATIENT USES [facility]

## 2019-02-28 MED ORDER — HYDROCODONE-ACETAMINOPHEN 5-325 MG PO TABS: 1.0000 | ORAL_TABLET | Freq: Two times a day (BID) | ORAL | 0 refills | Status: AC | PRN

## 2019-03-06 ENCOUNTER — Other Ambulatory Visit: Payer: Self-pay | Admitting: Orthopedic Surgery

## 2019-03-06 DIAGNOSIS — M2242 Chondromalacia patellae, left knee: Secondary | ICD-10-CM

## 2019-03-12 ENCOUNTER — Encounter: Payer: Self-pay | Admitting: Orthopedic Surgery

## 2019-03-12 ENCOUNTER — Other Ambulatory Visit: Payer: Self-pay

## 2019-03-12 ENCOUNTER — Ambulatory Visit (INDEPENDENT_AMBULATORY_CARE_PROVIDER_SITE_OTHER): Payer: BLUE CROSS/BLUE SHIELD | Admitting: Orthopedic Surgery

## 2019-03-12 DIAGNOSIS — G8929 Other chronic pain: Secondary | ICD-10-CM | POA: Diagnosis not present

## 2019-03-12 DIAGNOSIS — M25562 Pain in left knee: Secondary | ICD-10-CM | POA: Diagnosis not present

## 2019-03-12 DIAGNOSIS — M2242 Chondromalacia patellae, left knee: Secondary | ICD-10-CM | POA: Diagnosis not present

## 2019-03-12 MED ORDER — HYDROCODONE-ACETAMINOPHEN 5-325 MG PO TABS: 1.0000 | ORAL_TABLET | Freq: Four times a day (QID) | ORAL | 0 refills | Status: AC | PRN

## 2019-03-12 NOTE — Progress Notes (Signed)
Virtual Visit via Video Note  I connected with Angel Robinson on 03/12/19 at  1:40 PM EDT by a video enabled telemedicine application and verified that I am speaking with the correct person using two identifiers.  Location: Patient:home  Provider: office   I discussed the limitations of evaluation and management by telemedicine and the availability of in person appointments. The patient expressed understanding and agreed to proceed.     I discussed the assessment and treatment plan with the patient. The patient was provided an opportunity to ask questions and all were answered. The patient agreed with the plan and demonstrated an understanding of the instructions.   The patient was advised to call back or seek an in-person evaluation if the symptoms worsen or if the condition fails to improve as anticipated.  I provided 5 minutes of non-face-to-face time during this encounter.  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  She was never able to go to physical therapy because a COVID-19  She still having pain in her knee gabapentin did not help she is had pain at night she takes some Advil during the day and a half a hydrocodone as needed  Review of systems again she denies any back pain except during her monthly cycle  Plan is to have her come in and get a back x-ray and I will reexamine her knee at that time.  I think she has some pain coming from a different source.  If the back x-ray is completely normal then I would send her for chronic pain management as there is no surgical lesion and I would not do an arthroscopy without a definable lesion in the knee  Encounter Diagnoses  Name Primary?  . Chondromalacia patellae of left knee Yes  . Chronic pain of left knee     Meds  ordered this encounter  Medications  . HYDROcodone-acetaminophen (NORCO/VICODIN) 5-325 MG tablet    Sig: Take 1 tablet by mouth every 6 (six) hours as needed for up to 7 days for moderate pain.    Dispense:  28 tablet    Refill:  0    Fuller Canada, MD

## 2019-03-28 ENCOUNTER — Ambulatory Visit: Payer: BLUE CROSS/BLUE SHIELD | Admitting: Orthopedic Surgery

## 2019-05-14 ENCOUNTER — Telehealth: Payer: Self-pay | Admitting: Orthopedic Surgery

## 2019-05-14 NOTE — Telephone Encounter (Signed)
Patient called to request prescription for pain, for left knee; states has Gabapentin, and doesn't tolerate that well. Aware she had to cancel appointment for new problem of back pain, during covid-19.  Please review and advise if appointment needs to be re-scheduled prior to prescribing medication.  States uses [facility], [place].

## 2019-05-14 NOTE — Telephone Encounter (Signed)
Called back to patient to relay; voiced understanding. Also requested to re-schedule appointment. Done.

## 2019-05-14 NOTE — Telephone Encounter (Signed)
DOCTOR FEELS OPIOIDS NOT APPROPRIATE

## 2019-05-28 ENCOUNTER — Ambulatory Visit: Payer: BLUE CROSS/BLUE SHIELD | Admitting: Orthopedic Surgery

## 2020-05-19 ENCOUNTER — Telehealth: Payer: Self-pay | Admitting: General Practice

## 2020-05-19 NOTE — Telephone Encounter (Signed)
Called patient and left voicemail for her to callback to get established as a new patient with a provider. 

## 2020-12-14 ENCOUNTER — Ambulatory Visit: Payer: Self-pay | Admitting: Obstetrics and Gynecology

## 2021-01-11 ENCOUNTER — Encounter: Payer: Self-pay | Admitting: Obstetrics and Gynecology

## 2021-01-11 NOTE — Patient Instructions (Signed)
I value your feedback and you entrusting us with your care. If you get a [facility] patient survey, I would appreciate you taking the time to let us know about your experience today. Thank you! ? ? ?

## 2021-01-11 NOTE — Progress Notes (Signed)
This encounter was created in error - please disregard. °
